# Patient Record
Sex: Female | Born: 1975 | Race: White | Marital: Single
Health system: Southern US, Community
[De-identification: ages and names within clinical notes are randomized; demographics above are authoritative.]

---

## 2011-02-05 ENCOUNTER — Emergency Department (HOSPITAL_COMMUNITY)
Admission: EM | Admit: 2011-02-05 | Discharge: 2011-02-05 | Disposition: A | Discharge status: Home / Self Care | Payer: Medicaid Other | Attending: Emergency Medicine | Admitting: Emergency Medicine

## 2011-02-05 ENCOUNTER — Emergency Department (HOSPITAL_COMMUNITY): Payer: Medicaid Other

## 2011-02-05 DIAGNOSIS — M545 Low back pain, unspecified: Secondary | ICD-10-CM | POA: Insufficient documentation

## 2011-02-05 DIAGNOSIS — M549 Dorsalgia, unspecified: Secondary | ICD-10-CM | POA: Insufficient documentation

## 2011-02-05 DIAGNOSIS — R109 Unspecified abdominal pain: Secondary | ICD-10-CM | POA: Insufficient documentation

## 2011-02-05 DIAGNOSIS — N390 Urinary tract infection, site not specified: Secondary | ICD-10-CM | POA: Insufficient documentation

## 2011-02-05 DIAGNOSIS — N201 Calculus of ureter: Secondary | ICD-10-CM | POA: Insufficient documentation

## 2011-02-05 LAB — URINALYSIS, ROUTINE W REFLEX MICROSCOPIC
Bilirubin Urine: NEGATIVE
Glucose, UA: NEGATIVE mg/dL
Ketones, ur: 15 mg/dL — AB
Protein, ur: 30 mg/dL — AB
Protein, ur: NEGATIVE mg/dL
Urobilinogen, UA: 0.2 mg/dL (ref 0.0–1.0)

## 2011-02-05 LAB — URINE MICROSCOPIC-ADD ON

## 2011-02-06 LAB — URINE CULTURE
Colony Count: NO GROWTH
Culture  Setup Time: 201205021537

## 2011-02-07 LAB — POCT I-STAT, CHEM 8
BUN: 11 mg/dL (ref 6–23)
HCT: 40 % (ref 36.0–46.0)
Hemoglobin: 13.6 g/dL (ref 12.0–15.0)
Sodium: 140 mEq/L (ref 135–145)
TCO2: 20 mmol/L (ref 0–100)

## 2011-06-17 ENCOUNTER — Emergency Department (HOSPITAL_COMMUNITY): Payer: Medicaid Other

## 2011-06-17 ENCOUNTER — Emergency Department (HOSPITAL_COMMUNITY)
Admission: EM | Admit: 2011-06-17 | Discharge: 2011-06-17 | Disposition: A | Discharge status: Home / Self Care | Payer: Medicaid Other | Attending: Emergency Medicine | Admitting: Emergency Medicine

## 2011-06-17 DIAGNOSIS — N390 Urinary tract infection, site not specified: Secondary | ICD-10-CM | POA: Insufficient documentation

## 2011-06-17 DIAGNOSIS — N200 Calculus of kidney: Secondary | ICD-10-CM | POA: Insufficient documentation

## 2011-06-17 DIAGNOSIS — R109 Unspecified abdominal pain: Secondary | ICD-10-CM | POA: Insufficient documentation

## 2011-06-17 DIAGNOSIS — M549 Dorsalgia, unspecified: Secondary | ICD-10-CM | POA: Insufficient documentation

## 2011-06-17 LAB — URINE MICROSCOPIC-ADD ON

## 2011-06-17 LAB — URINALYSIS, ROUTINE W REFLEX MICROSCOPIC
Nitrite: POSITIVE — AB
Protein, ur: 30 mg/dL — AB
Urobilinogen, UA: 0.2 mg/dL (ref 0.0–1.0)

## 2011-06-30 ENCOUNTER — Inpatient Hospital Stay (HOSPITAL_COMMUNITY)
Admission: AD | Admit: 2011-06-30 | Discharge: 2011-07-02 | DRG: 694 | Disposition: A | Discharge status: Home / Self Care | Payer: Medicaid Other | Source: Ambulatory Visit | Attending: Urology | Admitting: Urology

## 2011-06-30 ENCOUNTER — Inpatient Hospital Stay (HOSPITAL_COMMUNITY): Payer: Medicaid Other

## 2011-06-30 ENCOUNTER — Emergency Department (HOSPITAL_COMMUNITY)
Admission: EM | Admit: 2011-06-30 | Discharge: 2011-06-30 | Disposition: A | Discharge status: Other Acute Inpatient Hospital | Payer: Medicaid Other | Source: Home / Self Care | Attending: Emergency Medicine | Admitting: Emergency Medicine

## 2011-06-30 ENCOUNTER — Emergency Department (HOSPITAL_COMMUNITY): Payer: Medicaid Other

## 2011-06-30 DIAGNOSIS — N201 Calculus of ureter: Principal | ICD-10-CM | POA: Diagnosis present

## 2011-06-30 DIAGNOSIS — N12 Tubulo-interstitial nephritis, not specified as acute or chronic: Secondary | ICD-10-CM | POA: Diagnosis present

## 2011-06-30 DIAGNOSIS — Z87442 Personal history of urinary calculi: Secondary | ICD-10-CM

## 2011-06-30 DIAGNOSIS — R509 Fever, unspecified: Secondary | ICD-10-CM | POA: Diagnosis present

## 2011-06-30 DIAGNOSIS — D72829 Elevated white blood cell count, unspecified: Secondary | ICD-10-CM | POA: Diagnosis present

## 2011-06-30 DIAGNOSIS — R109 Unspecified abdominal pain: Secondary | ICD-10-CM | POA: Diagnosis present

## 2011-06-30 DIAGNOSIS — N1330 Unspecified hydronephrosis: Secondary | ICD-10-CM | POA: Diagnosis present

## 2011-06-30 LAB — DIFFERENTIAL
Lymphs Abs: 1.7 10*3/uL (ref 0.7–4.0)
Monocytes Relative: 4 % (ref 3–12)
Neutro Abs: 19.1 10*3/uL — ABNORMAL HIGH (ref 1.7–7.7)
Neutrophils Relative %: 88 % — ABNORMAL HIGH (ref 43–77)

## 2011-06-30 LAB — BASIC METABOLIC PANEL
BUN: 17 mg/dL (ref 6–23)
Calcium: 9 mg/dL (ref 8.4–10.5)
Creatinine, Ser: 0.84 mg/dL (ref 0.50–1.10)
GFR calc Af Amer: >60 mL/min (ref >60)
GFR calc non Af Amer: >60 mL/min (ref >60)
Glucose, Bld: 120 mg/dL — ABNORMAL HIGH (ref 70–99)
Potassium: 3.7 mEq/L (ref 3.5–5.1)

## 2011-06-30 LAB — CBC
Hemoglobin: 12.7 g/dL (ref 12.0–15.0)
MCH: 28.3 pg (ref 26.0–34.0)
MCV: 84 fL (ref 78.0–100.0)
RBC: 4.49 MIL/uL (ref 3.87–5.11)
WBC: 21.8 10*3/uL — ABNORMAL HIGH (ref 4.0–10.5)

## 2011-06-30 LAB — URINALYSIS, ROUTINE W REFLEX MICROSCOPIC
Glucose, UA: NEGATIVE mg/dL
Ketones, ur: >80 mg/dL — AB
Specific Gravity, Urine: 1.027 (ref 1.005–1.030)
pH: 6 (ref 5.0–8.0)

## 2011-06-30 LAB — URINE MICROSCOPIC-ADD ON

## 2011-07-02 LAB — CBC
MCH: 27.7 pg (ref 26.0–34.0)
MCHC: 32.6 g/dL (ref 30.0–36.0)
MCV: 85 fL (ref 78.0–100.0)
Platelets: 229 10*3/uL (ref 150–400)
RBC: 4.01 MIL/uL (ref 3.87–5.11)
RDW: 13.6 % (ref 11.5–15.5)

## 2011-07-02 LAB — URINE CULTURE

## 2011-07-06 LAB — STONE ANALYSIS: Stone Weight KSTONE: 0.065 g

## 2011-07-07 LAB — CULTURE, BLOOD (ROUTINE X 2)
Culture  Setup Time: 201209250456
Culture  Setup Time: 201209250456
Culture: NO GROWTH
Culture: NO GROWTH

## 2011-07-10 NOTE — H&P (Signed)
[name], Morales                   ACCOUNT NO.:  [id number]  MEDICAL RECORD NO.:  [id number]  LOCATION:  1427                         FACILITY:  [facility]  PHYSICIAN:  Excell Seltzer. Annabell Howells, M.D.    DATE OF BIRTH:  Sep 02, 1976  DATE OF ADMISSION:  06/30/2011 DATE OF DISCHARGE:                             HISTORY & PHYSICAL   CHIEF COMPLAINT:  Right flank pain.  HISTORY:  Breanna Morales is a 35 year old female with a history of stones who saw Dr. Aldean Ast back in May.  She passed a 5 mm stone at that time.  She was seen in the emergency room on June 17, 2011, for left-sided pain and history of stones.  At that point, she was found to have an 8 mm right upper pole stone and 8 mm right lower pole stone, and the urinary tract infection.  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  She was given pain medicine and prescription for antibiotics at that time, but due to cost, she was unable to get them filled.  Her symptoms improved, but she returned this morning with severe right flank pain and fever to just over 100.  A renal ultrasound in the emergency room revealed mild right hydro.  She was given Dilaudid and Toradol initially without relief of pain, but eventually got significant relief with morphine and is currently pain free.  She also received antibiotics in the emergency room, but I do not know what that was.  She denies any voiding complaints at this time.  Her past history is significant for out intolerance to TYLOX with itching, but she has taken Percocet in the past.  She also has allergies to CIPRO and SULFA.  CURRENT MEDICATIONS:  None.  MEDICAL HISTORY:  Pertinent for urolithiasis.  SURGICAL HISTORY:  Significant for 2 C-sections.  She has had an abortion when she was a teenager.  She has had D and Cs x2 and required blood transfusion in 1997 with one miscarriage.  SOCIAL HISTORY:  Negative tobacco, alcohol, or recreational drugs.  She is currently a Consulting civil engineer.  FAMILY  HISTORY:  Grandmother had seizures.  Father was an alcoholic.  REVIEW OF SYSTEMS:  She has had no headaches, but has had the fever, nausea, vomiting, and pain.  She has had no diarrhea, but she had constipation.  She denies chest pain or shortness of breath.  She has had no extremity edema.  She has had marked weight swings of 15 to 30 pounds that are unexplained.  She has had some tingling and numbness in her right leg, but that is intermittent.  She does have issues with depression and anxiety.  She denies any vaginal bleeding at this time. Her last period was on June 16, 2011.  She is voiding without complaints and has no hematuria.  She is otherwise without complaints on full 12-point review of systems.  PHYSICAL EXAMINATION:  VITAL SIGNS:  On exam, today her temperature is 99.6, pulse of 110, respirations 20, and blood pressure is 136/87. GENERAL:  She is a well-developed, well-nourished white female in no acute distress, quite comfortable without pain.  She is alert and oriented x3. HEENT:  Head and face normocephalic, atraumatic. NECK:  Supple without mass. LUNGS:  Clear to auscultation. HEART:  Regular rate and rhythm without murmur. ABDOMEN:  Soft, obese with mild right lower quadrant tenderness.  No mass, splenomegaly, or hernias noted. LYMPHATICS:  She has no supraclavicular or inguinal adenopathy. GENITOURINARY AND RECTAL EXAM:  Deferred. EXTREMITIES:  Full range of motion without edema. SKIN:  Warm and dry. NEUROLOGIC:  She has no focal deficits noted. PSYCHIATRIC:  Her mood and affect is appropriate.  ACCESSORY CLINICAL INFORMATION:  CBC:  White count 21.8, hemoglobin 12.7, hemoglobin is 37.7, and platelet counts 339,000.  She does have left shift.  BMP:  Sodium is 136, potassium 3.7, chloride 102, CO2 of 25, BUN 17, creatinine 0.84, and glucose 120.  Her urine from this admission was nitrite negative with only small leukocyte esterase with 11 to 20 white cells,  32 red cells, few bacteria.  A urine from June 17, 2011, was nitrite positive with too numerous to count white cells, some red cells, and bacteria.  A culture was not done at that time.  Renal ultrasound at this admission revealed mild right hydro and a renal cyst.  I have reviewed those films.  A CT scan from June 17, 2011 revealed 2 nonobstructing right renal stones, 6 and 8 cm, I have reviewed those films.  KUB from Feb 17, 2011, reveals that her stones are radiopaque.  A CT from Feb 05, 2011, revealed a 5 mm right distal stone and right ovarian cyst, the distal stone has passed.  IMPRESSION: 1. Right flank pain with hydro and possible ureteral stone.  The     patient with known nephrolithiasis. 2. Urinary tract infection with leukocytosis and fever.  PLAN: 1. I will obtain a stat KUB in the x-ray department tonight to     determine whether if the ureteral stones are visible. 2. She will be kept n.p.o. post midnight and posted for possible     cystoscopy stent insertion and possible ureteroscopy tomorrow.  The     risks of bleeding, infection, ureteral injury, thrombotic events,     anesthetic complications, need for second procedures were reviewed. 3. We will initiate Rocephin and gentamicin IV and we will culture her     urine.     Excell Seltzer. Annabell Howells, M.D.     JJW/MEDQ  D:  06/30/2011  T:  07/01/2011  Job:  [id number]  Electronically Signed by Bjorn Pippin M.D. on 07/10/2011 10:40:38 AM

## 2011-07-10 NOTE — Discharge Summary (Signed)
NAMEKAMYA, WATLING                   ACCOUNT NO.:  [id number]  MEDICAL RECORD NO.:  [id number]  LOCATION:  1427                         FACILITY:  [facility]  PHYSICIAN:  Excell Seltzer. Annabell Howells, M.D.    DATE OF BIRTH:  [birth year]  DATE OF ADMISSION:  06/30/2011 DATE OF DISCHARGE:  07/02/2011                              DISCHARGE SUMMARY   HISTORY:  Briefly, Breanna Morales is a 35 year old white female with a history of stones who was admitted through the emergency room with severe right flank pain and right hydronephrosis.  She was transferred from the [facility] Emergency Room to [facility] for direct admission.  She was also noted to have a fever over 100 prior to admission and had a white count of 21.7 on admission.  Her urine had pyuria and hematuria, and it was felt that IV antibiotics with probable stent placement were indicated.  ALLERGIES:  Itching with TYLOX.  She has allergies to CIPRO and SULFA.  HOME MEDICATIONS:  None.  She did not get the pain or antibiotic medications filled after a visit to the emergency room on September 11th when she was felt to have an infection at that time.  CT at that date just showed renal stones and ureteral stones.  PAST MEDICAL HISTORY:  Pertinent for urolithiasis.  PAST SURGICAL HISTORY:  C-sections, abortion, D and C x2 with blood transfusion for uterine bleeding with 1 miscarriage.  For additional details of history and physical, see the dictated H and P.  ACCESSORY CLINICAL INFORMATION:  Her CT films were reviewed, her KUB revealed no obvious stone, but there was stool obscuring the right kidney.  Blood and urine cultures were unremarkable, but the patient received Rocephin in the emergency room before cultures were obtained.  HOSPITAL COURSE:  On the day following admission, she had passed her stone in the night and had no further pain.  On review of systems, she had no chills or nausea and vomiting, but she had a temperature to 99.5 with a  persistent tachycardia of 108.  Her abdomen is soft and nontender.  KUB obtained on admission revealed no obvious stones.  She was initially placed on Rocephin and gentamicin and that was continued. Her cystoscopy, which had been scheduled was cancelled.  On September 26th, she continued to be without complaint, but did have a fever the night before to 102.3.  REVIEW OF SYSTEMS:  She had no subjective fever, chills, and nausea and vomiting, but on exam her T-max was 99.1 with a heart rate 106.  Her T- max the night before had been 102.3 and she had another temperature over 101 in the early morning hours.  A repeat CBC revealed decline in her white count to 16.8.  The blood and urine cultures returned and were unremarkable.  At this point, the patient stated that she was going to leave today no matter what because she did not have adequate childcare.  I asked her to wait until I had the cultures back.  I got a call around 12 o'clock stating that she was going to call a cab if she was not discharged. Since I had the cultures back,  I felt I can make an antibiotic choice for her, although it would be empiric since I did not have culture information that directed me.  She was allowed to go home against medical advice and a prescription for Augmentin 875 mg p.o. t.i.d., #30 were sent to our pharmacy.  She will contact our office for followup in about 1 week and her stone was sent for analysis.  FINAL DIAGNOSIS:  Right ureteral stone with pyelonephritis.  COMPLICATIONS:  None, but she did leave against medical advice.  CONDITION:  Improved.     Excell Seltzer. Annabell Howells, M.D.     JJW/MEDQ  D:  07/02/2011  T:  07/03/2011  Job:  [id number]  Electronically Signed by Bjorn Pippin M.D. on 07/10/2011 10:40:33 AM

## 2012-07-08 IMAGING — US US RENAL
1 series · 14 of 25 positions shown · non-contrast
Comparison: CT dated 06/17/2011

CLINICAL DATA: Abdominal pain

RENAL/URINARY TRACT ULTRASOUND COMPLETE

[Series 1: us renal · 0.33mm/px · 14 of 32 slices shown]
[im 1/32]
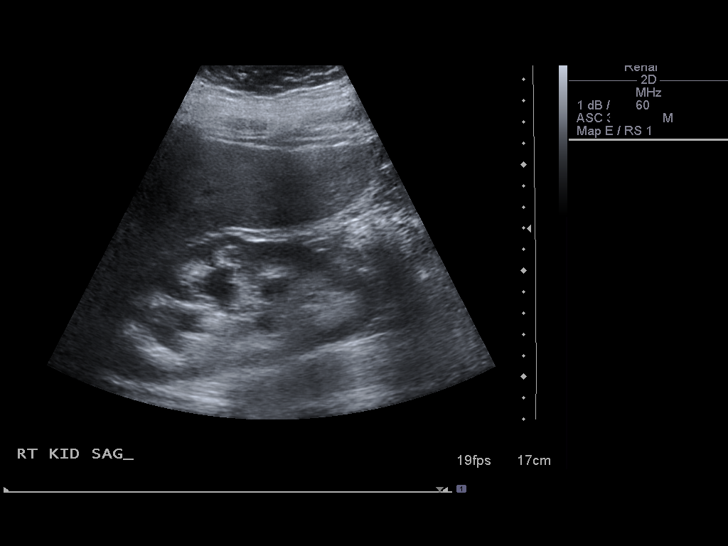
[im 3/32]
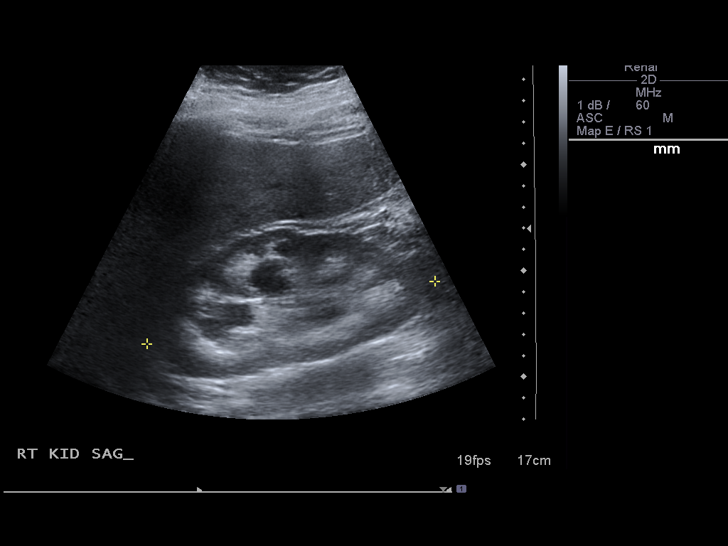
[im 6/32]
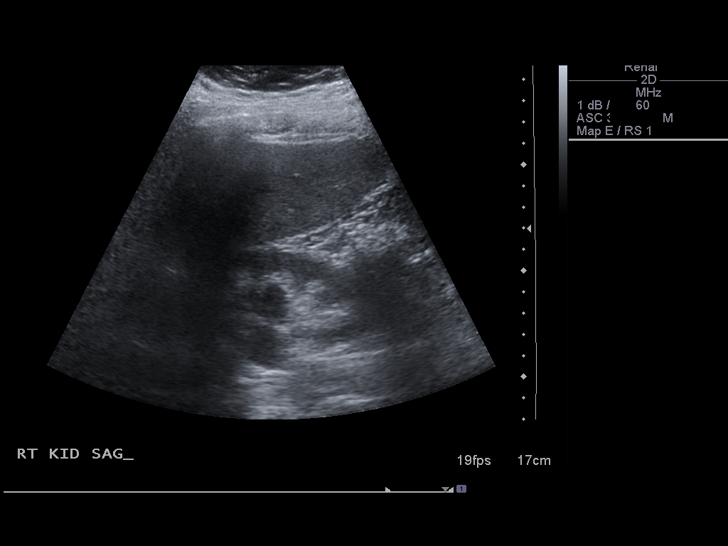
[im 8/32]
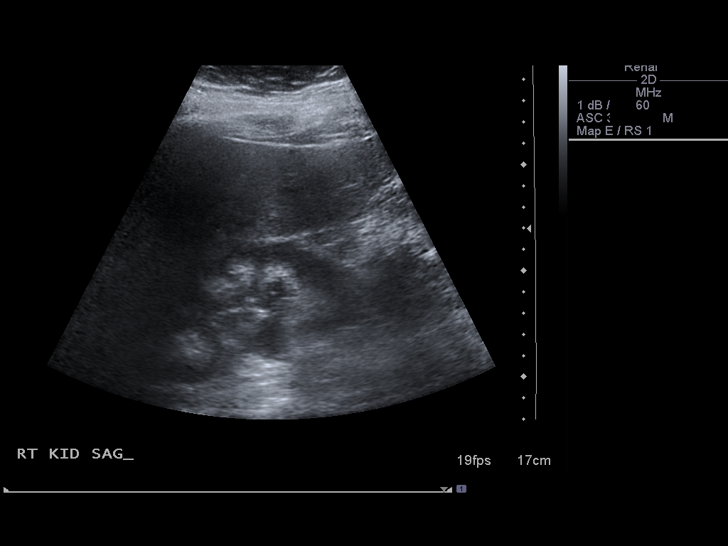
[im 11/32]
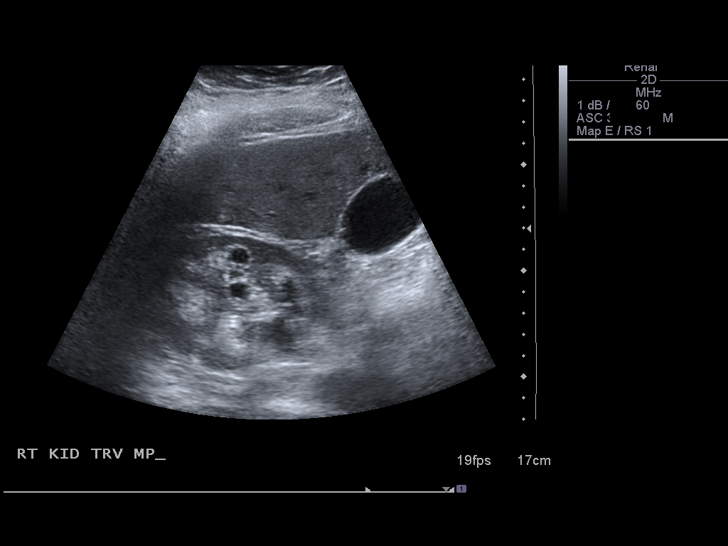
[im 12/32]
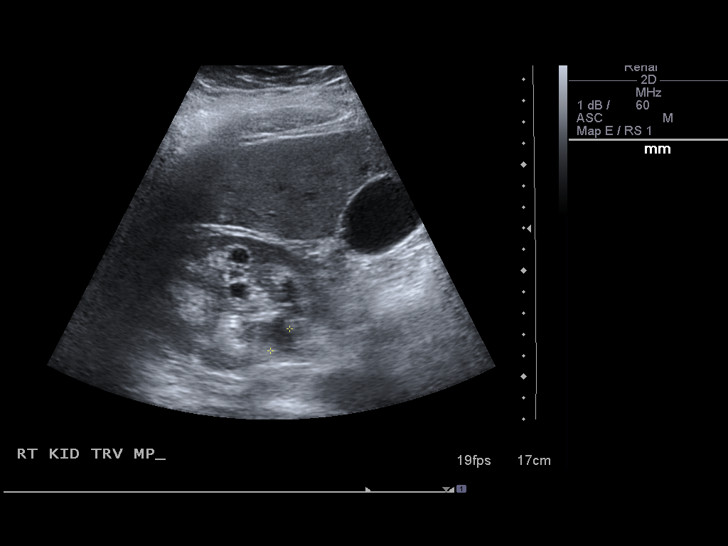
[im 15/32]
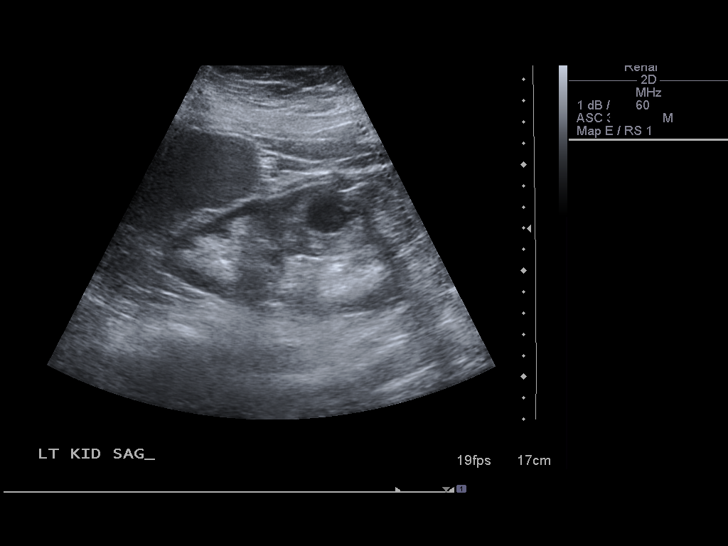
[im 17/32]
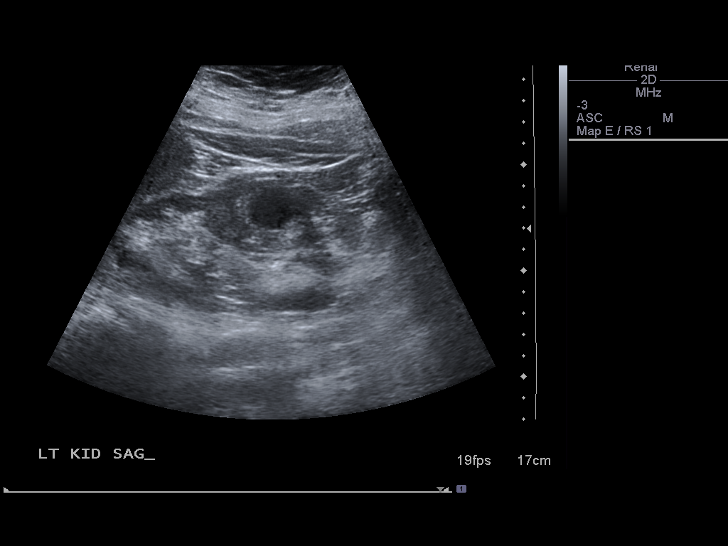
[im 20/32]
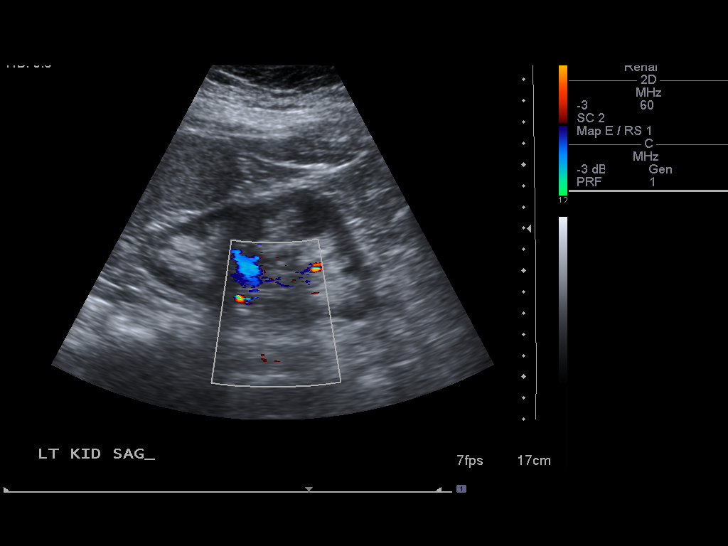
[im 21/32]
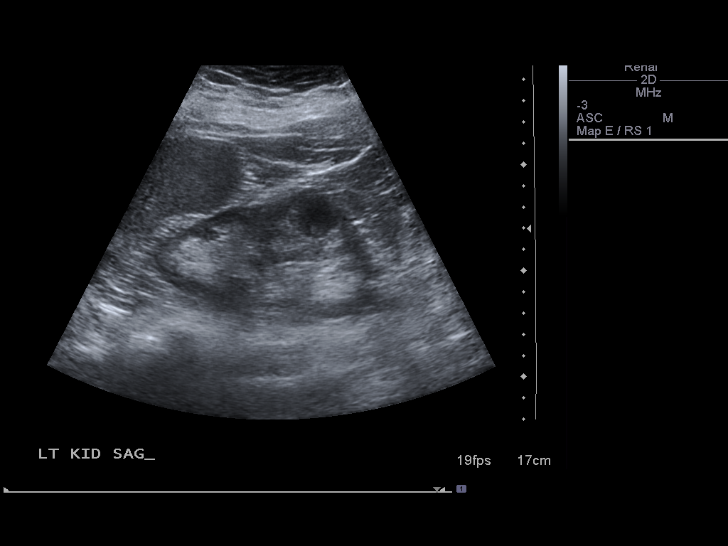
[im 24/32]
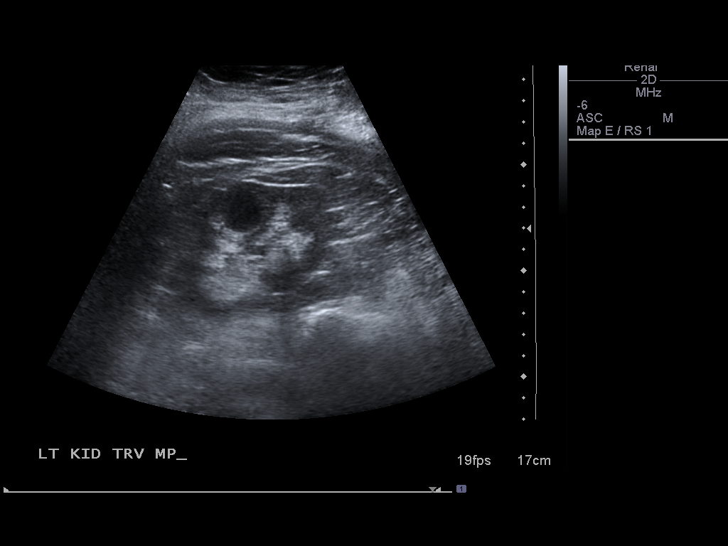
[im 26/32]
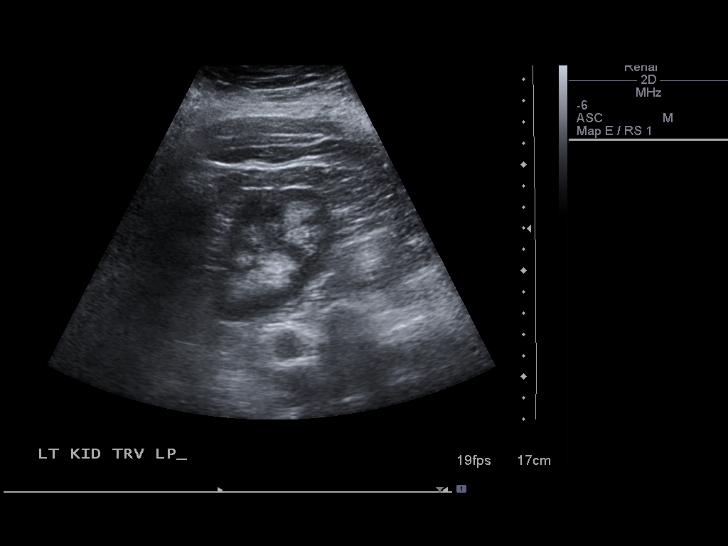
[im 29/32]
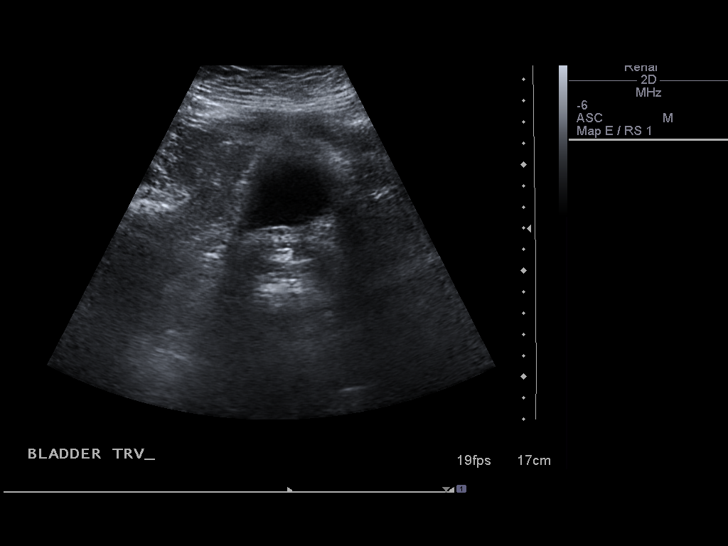
[im 32/32]
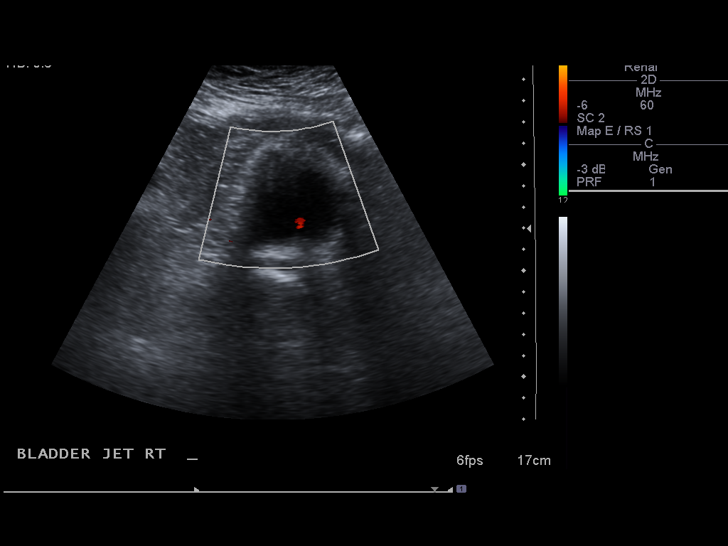

[14 of 25 positions shown; findings below may reference images not displayed]

FINDINGS: Right Kidney:  Measures 14.0 cm.  Mild right hydronephrosis.

Left Kidney:  Measures 12.1 cm.  2.3 x 2.2 x 2.0 cm interpolar
cyst.  No hydronephrosis.

Bladder:  Within normal limits.  Bilateral bladder jets visualized.
IMPRESSION: Mild right hydronephrosis.

Bilateral bladder jets visualized.

## 2012-07-08 IMAGING — CR DG ABDOMEN 1V
1 series · 2 of 2 positions shown · non-contrast
Comparison: CT abdomen pelvis without contrast 06/17/2011

CLINICAL DATA: No right-sided kidney stone

ABDOMEN - 1 VIEW

[Series 1: AP · U · 2 of 2 slices shown]
[im 1/2]
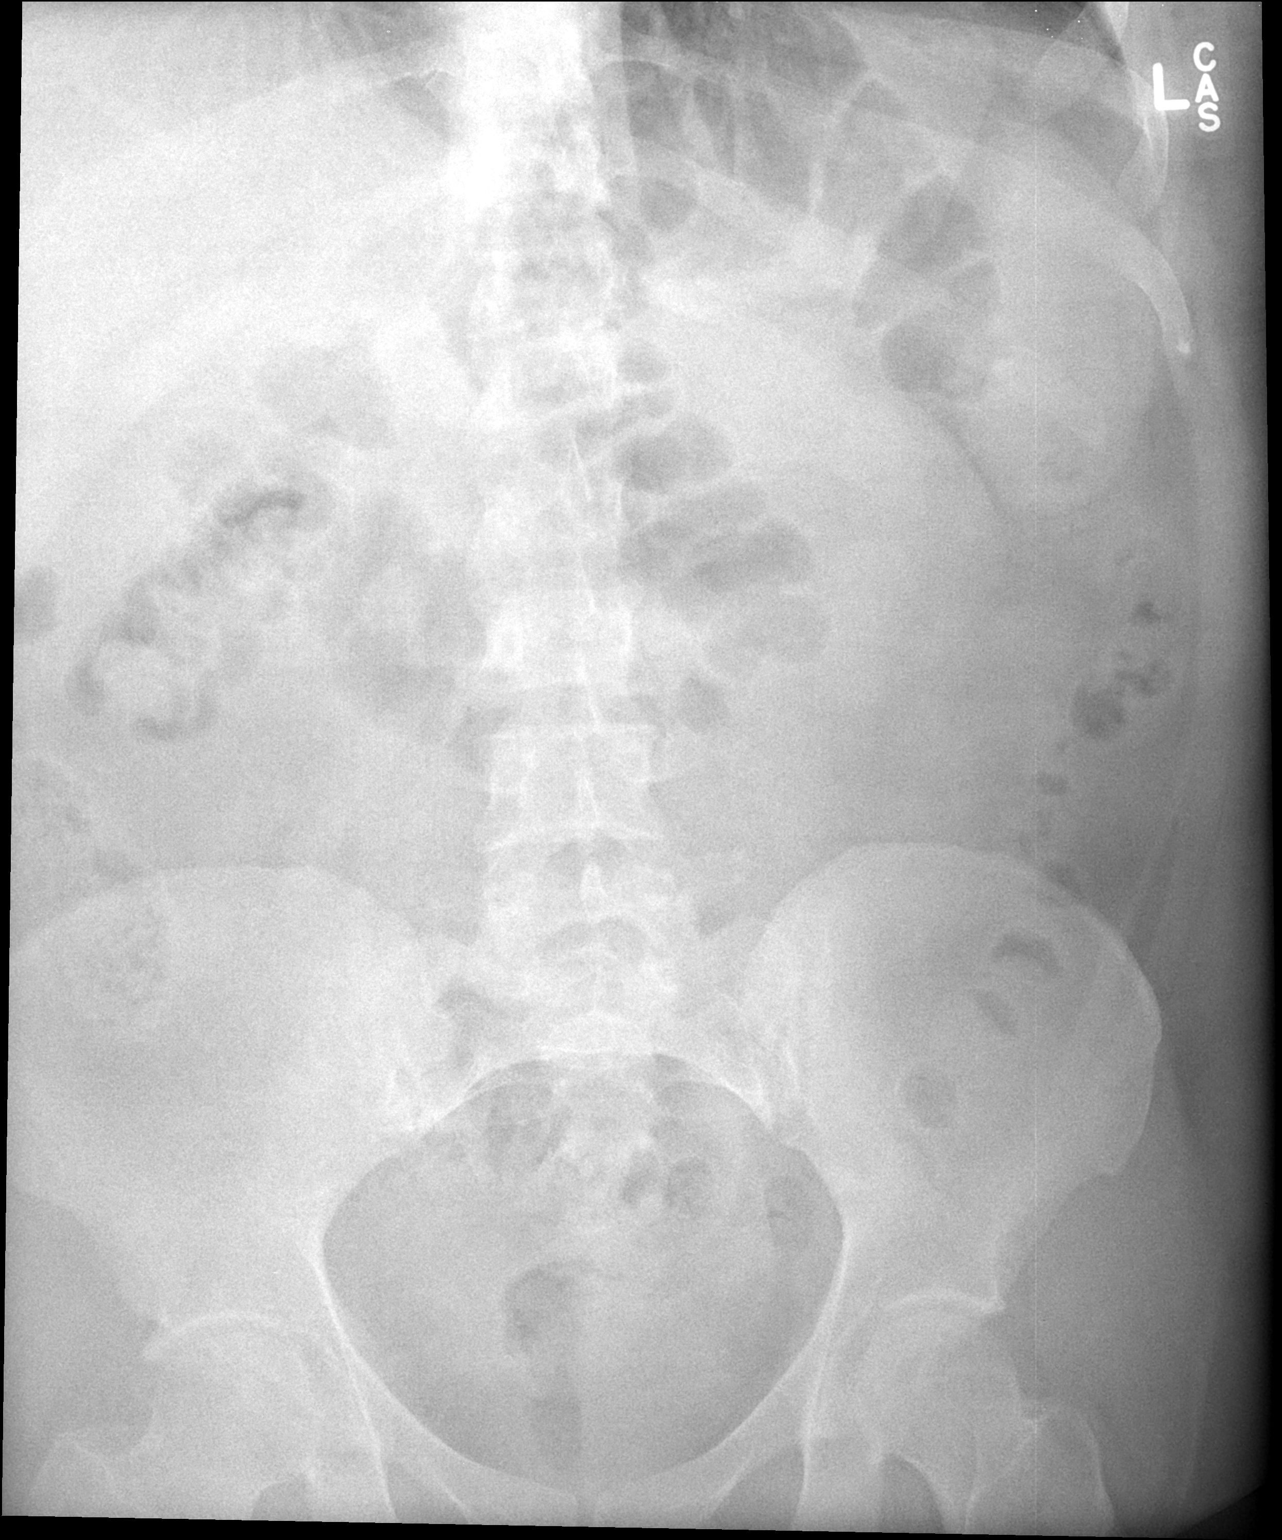
[im 2/2]
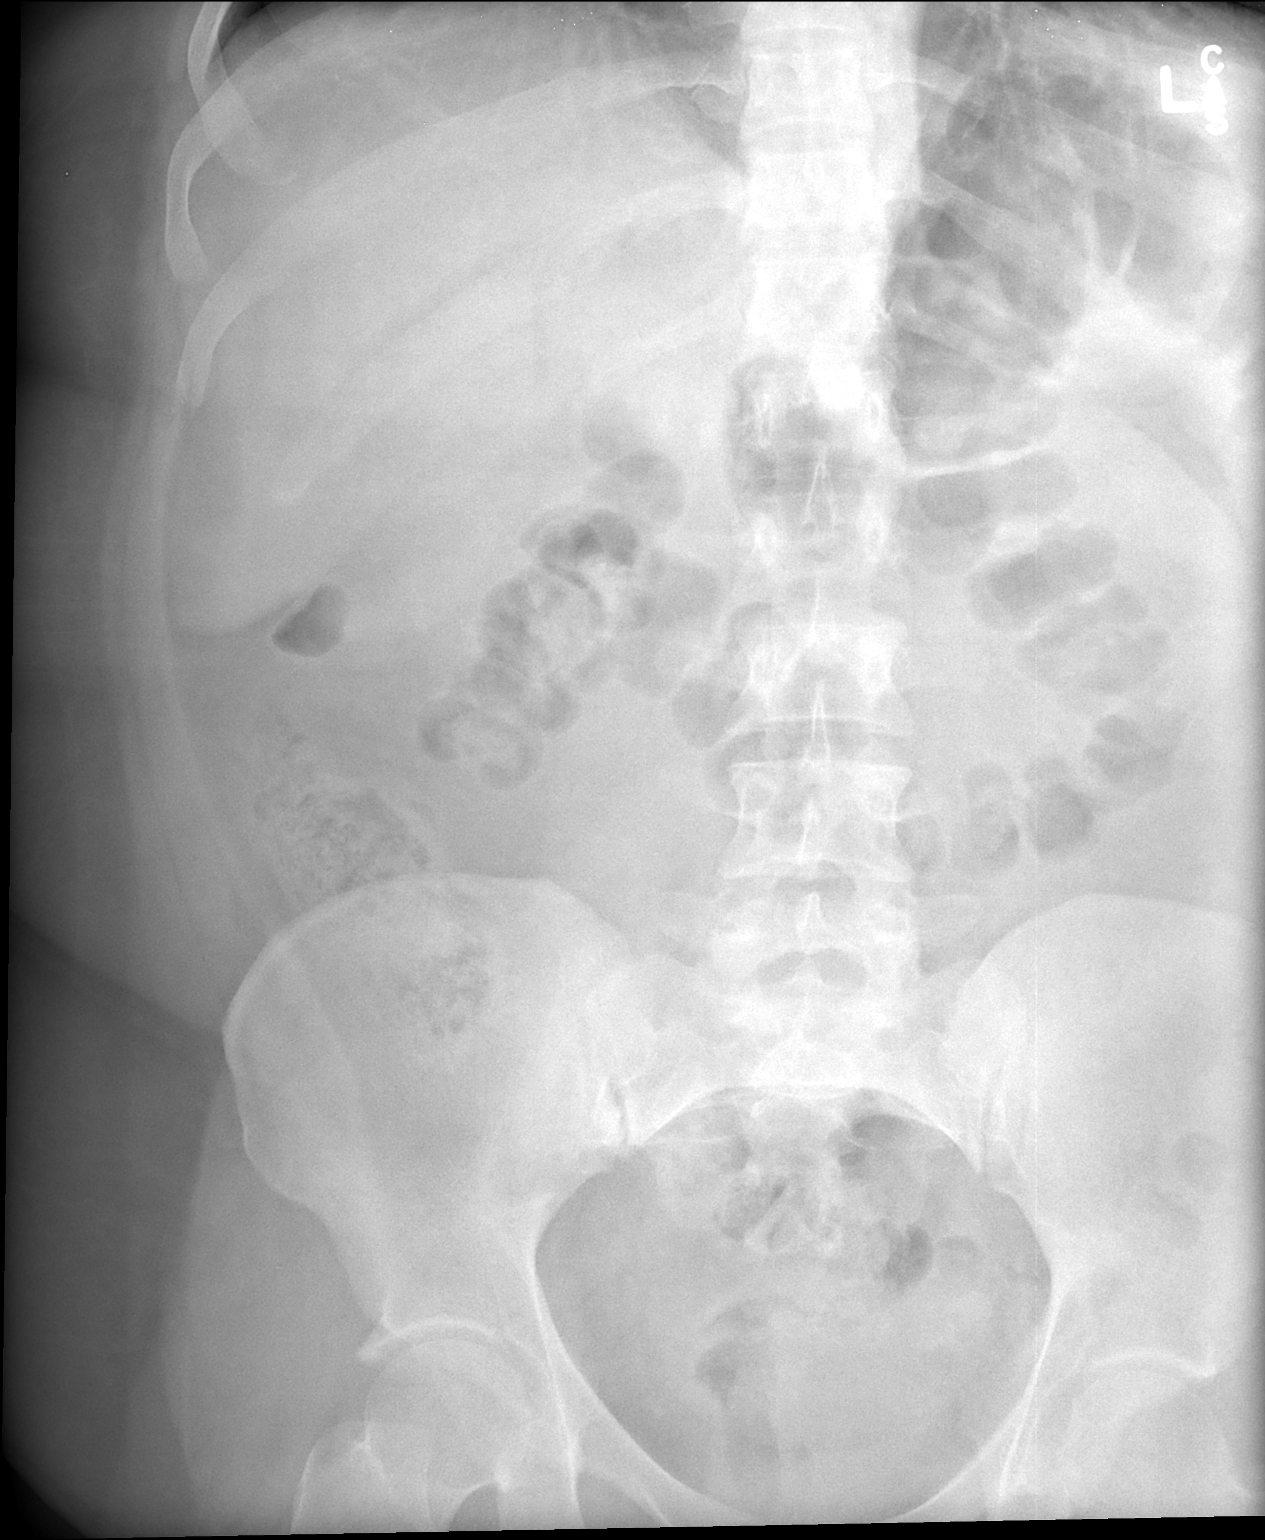

[2 of 2 positions shown; findings below may reference images not displayed]

FINDINGS: Stool-filled transverse colon projects over the expected
location of the right kidney.  No discrete urinary tract calculi
are visualized in the abdomen or pelvis. The bowel gas pattern is
nonobstructive.  No acute bony abnormality.
IMPRESSION: No urinary tract calculus is visualized.  Please note that small
calculi could be obscured/occult due to overlying bowel/stool and
patient body habitus.

## 2020-03-02 ENCOUNTER — Ambulatory Visit: Payer: Self-pay

## 2020-03-02 NOTE — Telephone Encounter (Signed)
This encounter was created in error - please disregard.

## 2020-03-02 NOTE — Telephone Encounter (Signed)
Patient called and says about 3 hours ago she developed pain to the inner left thigh a few inches above the knee. She denies any injury to the leg, no strenuous exercise, nothing out of the ordinary. She denies warmth, swelling to the area. She says the pain is constant at a 4/10. I recommended to the ED or UC per protocol for the pain, she says she will wait to see if it gets worse. She says she doesn't want that bill if she doesn't have to. Care advice given, she verbalized understanding.  Reason for Disposition . Localized pain, redness or hard lump along vein  Answer Assessment - Initial Assessment Questions 1. ONSET: "When did the pain start?"      3 hours ago 2. LOCATION: "Where is the pain located?"      Left thigh to the inner above the knee 3. PAIN: "How bad is the pain?"    (Scale 1-10; or mild, moderate, severe)   -  MILD (1-3): doesn't interfere with normal activities    -  MODERATE (4-7): interferes with normal activities (e.g., work or school) or awakens from sleep, limping    -  SEVERE (8-10): excruciating pain, unable to do any normal activities, unable to walk     4 4. WORK OR EXERCISE: "Has there been any recent work or exercise that involved this part of the body?"      Nothing out of the normal 5. CAUSE: "What do you think is causing the leg pain?"     No idea 6. OTHER SYMPTOMS: "Do you have any other symptoms?" (e.g., chest pain, back pain, breathing difficulty, swelling, rash, fever, numbness, weakness)     None 7. PREGNANCY: "Is there any chance you are pregnant?" "When was your last menstrual period?"     No  Protocols used: LEG PAIN-A-AH
